# Patient Record
Sex: Male | Born: 1980 | Race: Black or African American | Hispanic: No | Marital: Single | Smoking: Never smoker
Health system: Southern US, Community
[De-identification: ages and names within clinical notes are randomized; demographics above are authoritative.]

---

## 2007-10-21 ENCOUNTER — Emergency Department (HOSPITAL_COMMUNITY): Admission: EM | Admit: 2007-10-21 | Discharge: 2007-10-21 | Payer: Self-pay | Admitting: Emergency Medicine

## 2007-11-17 ENCOUNTER — Emergency Department (HOSPITAL_COMMUNITY): Admission: EM | Admit: 2007-11-17 | Discharge: 2007-11-17 | Payer: Self-pay | Admitting: Emergency Medicine

## 2008-08-14 ENCOUNTER — Emergency Department (HOSPITAL_COMMUNITY): Admission: EM | Admit: 2008-08-14 | Discharge: 2008-08-14 | Payer: Self-pay | Admitting: Emergency Medicine

## 2009-01-14 ENCOUNTER — Emergency Department (HOSPITAL_BASED_OUTPATIENT_CLINIC_OR_DEPARTMENT_OTHER): Admission: EM | Admit: 2009-01-14 | Discharge: 2009-01-15 | Payer: Self-pay | Admitting: Emergency Medicine

## 2013-05-18 HISTORY — PX: SHOULDER SURGERY: SHX246

## 2013-05-31 ENCOUNTER — Emergency Department (HOSPITAL_COMMUNITY): Payer: Self-pay

## 2013-05-31 ENCOUNTER — Encounter (HOSPITAL_COMMUNITY): Payer: Self-pay | Admitting: Emergency Medicine

## 2013-05-31 ENCOUNTER — Emergency Department (HOSPITAL_COMMUNITY)
Admission: EM | Admit: 2013-05-31 | Discharge: 2013-05-31 | Disposition: A | Discharge status: Home / Self Care | Payer: Self-pay | Attending: Emergency Medicine | Admitting: Emergency Medicine

## 2013-05-31 DIAGNOSIS — W108XXA Fall (on) (from) other stairs and steps, initial encounter: Secondary | ICD-10-CM | POA: Insufficient documentation

## 2013-05-31 DIAGNOSIS — W010XXA Fall on same level from slipping, tripping and stumbling without subsequent striking against object, initial encounter: Secondary | ICD-10-CM | POA: Insufficient documentation

## 2013-05-31 DIAGNOSIS — Y929 Unspecified place or not applicable: Secondary | ICD-10-CM | POA: Insufficient documentation

## 2013-05-31 DIAGNOSIS — S43006A Unspecified dislocation of unspecified shoulder joint, initial encounter: Secondary | ICD-10-CM | POA: Insufficient documentation

## 2013-05-31 DIAGNOSIS — S43005A Unspecified dislocation of left shoulder joint, initial encounter: Secondary | ICD-10-CM

## 2013-05-31 DIAGNOSIS — Y939 Activity, unspecified: Secondary | ICD-10-CM | POA: Insufficient documentation

## 2013-05-31 MED ORDER — OXYCODONE-ACETAMINOPHEN 5-325 MG PO TABS: 2.0000 | ORAL_TABLET | ORAL | Status: DC | PRN

## 2013-05-31 MED ORDER — KETOROLAC TROMETHAMINE 30 MG/ML IJ SOLN
30.0000 mg | Freq: Once | INTRAMUSCULAR | Status: AC
  Administered 2013-05-31: 30 mg via INTRAVENOUS
  Filled 2013-05-31: qty 1

## 2013-05-31 MED ORDER — METHOCARBAMOL 500 MG PO TABS
500.0000 mg | ORAL_TABLET | Freq: Two times a day (BID) | ORAL | Status: DC
Start: 2013-05-31 — End: 2013-05-31

## 2013-05-31 MED ORDER — SODIUM CHLORIDE 0.9 % IV SOLN
INTRAVENOUS | Status: AC | PRN
  Administered 2013-05-31: 125 mL/h via INTRAVENOUS

## 2013-05-31 MED ORDER — PROPOFOL 10 MG/ML IV BOLUS
100.0000 mg | Freq: Once | INTRAVENOUS | Status: DC
  Filled 2013-05-31: qty 20

## 2013-05-31 MED ORDER — MORPHINE SULFATE 4 MG/ML IJ SOLN: 4.0000 mg | Freq: Once | INTRAMUSCULAR | Status: DC

## 2013-05-31 MED ORDER — KETAMINE HCL 10 MG/ML IJ SOLN
100.0000 mg | Freq: Once | INTRAMUSCULAR | Status: DC
  Filled 2013-05-31: qty 10

## 2013-05-31 MED ORDER — MORPHINE SULFATE 4 MG/ML IJ SOLN
6.0000 mg | Freq: Once | INTRAMUSCULAR | Status: AC
  Administered 2013-05-31: 6 mg via INTRAVENOUS
  Filled 2013-05-31: qty 2

## 2013-05-31 MED ORDER — PROPOFOL 10 MG/ML IV BOLUS
INTRAVENOUS | Status: AC | PRN
  Administered 2013-05-31: 30 mg via INTRAVENOUS

## 2013-05-31 MED ORDER — METHOCARBAMOL 500 MG PO TABS
500.0000 mg | ORAL_TABLET | Freq: Two times a day (BID) | ORAL | Status: DC
Start: 2013-05-31 — End: 2021-02-12

## 2013-05-31 MED ORDER — HYDROMORPHONE HCL PF 1 MG/ML IJ SOLN
1.0000 mg | Freq: Once | INTRAMUSCULAR | Status: AC
  Administered 2013-05-31: 1 mg via INTRAVENOUS
  Filled 2013-05-31: qty 1

## 2013-05-31 MED ORDER — KETAMINE HCL 10 MG/ML IJ SOLN
INTRAMUSCULAR | Status: AC | PRN
  Administered 2013-05-31 (×4): 5 mg via INTRAVENOUS
  Administered 2013-05-31: 30 mg via INTRAVENOUS

## 2013-05-31 NOTE — ED Notes (Addendum)
Per EMS, patient fell down some icy steps today and dislocated his L shoulder.   Patient does have drop of the L shoulder compared to the right.   Patient states 10/10 pain sharp.   Patient states has chronic back pain that he gets dilaudid for that is hurting also.  Patient was given 200 mg Fentanyl IV en route.    Patient has 20 R hand placed by EMS.

## 2013-05-31 NOTE — ED Notes (Signed)
Checked with Petal[place]'Malley, [place].  She stated okay to place patient in FT to treat pain and get started before moving to room when available.

## 2013-05-31 NOTE — ED Notes (Signed)
Patient transported to X-ray 

## 2013-05-31 NOTE — Discharge Instructions (Signed)
Shoulder Dislocation  Your shoulder is made up of three bones: the collar bone (clavicle); the shoulder blade (scapula), which includes the socket (glenoid cavity); and the upper arm bone (humerus). Your shoulder joint is the place where these bones meet. Strong, fibrous tissues hold these bones together (ligaments). Muscles and strong, fibrous tissues that connect the muscles to these bones (tendons) allow your arm to move through this joint. The range of motion of your shoulder joint is more extensive than most of your other joints, and the glenoid cavity is very shallow. That is the reason that your shoulder joint is one of the most unstable joints in your body. It is far more prone to dislocation than your other joints. Shoulder dislocation is when your humerus is forced out of your shoulder joint.  CAUSES  Shoulder dislocation is caused by a forceful impact on your shoulder. This impact usually is from an injury, such as a sports injury or a fall.  SYMPTOMS  Symptoms of shoulder dislocation include:   Deformity of your shoulder.   Intense pain.   Inability to move your shoulder joint.   Numbness, weakness, or tingling around your shoulder joint (your neck or down your arm).   Bruising or swelling around your shoulder.  DIAGNOSIS  In order to diagnose a dislocated shoulder, your caregiver will perform a physical exam. Your caregiver also may have an X-ray exam done to see if you have any broken bones. Magnetic resonance imaging (MRI) is a procedure that sometimes is done to help your caregiver see any damage to the soft tissues around your shoulder, particularly your rotator cuff tendons. Additionally, your caregiver also may have electromyography done to measure the electrical discharges produced in your muscles if you have signs or symptoms of nerve damage.  TREATMENT  A shoulder dislocation is treated by placing the humerus back in the joint (reduction). Your caregiver does this either manually (closed  reduction), by moving your humerus back into the joint through manipulation, or through surgery (open reduction). When your humerus is back in place, severe pain should improve almost immediately.  You also may need to have surgery if you have a weak shoulder joint or ligaments, and you have recurring shoulder dislocations, despite rehabilitation. In rare cases, surgery is necessary if your nerves or blood vessels are damaged during the dislocation.  After your reduction, your arm will be placed in a shoulder immobilizer or sling to keep it from moving. Your caregiver will have you wear your shoulder immoblizer or sling for 3 days to 3 weeks, depending on how serious your dislocation is. When your shoulder immobilizer or sling is removed, your caregiver may prescribe physical therapy to help improve the range of motion in your shoulder joint.  HOME CARE INSTRUCTIONS   The following measures can help to reduce pain and speed up the healing process:   Rest your injured joint. Do not move it. Avoid activities similar to the one that caused your injury.   Apply ice to your injured joint for the first day or two after your reduction or as directed by your caregiver. Applying ice helps to reduce inflammation and pain.   Put ice in a plastic bag.   Place a towel between your skin and the bag.   Leave the ice on for 15-20 minutes at a time, every 2 hours while you are awake.   Exercise your hand by squeezing a soft ball. This helps to eliminate stiffness and swelling in your hand and   shoulder immobilizer or sling becomes damaged.  Your pain becomes worse rather than better.  You lose feeling in your arm or hand, or they become white and cold. MAKE SURE YOU:   Understand these instructions.  Will watch your condition.  Will get help right away if you are not  doing well or get worse. Document Released: 01/27/2001 Document Revised: 07/27/2011 Document Reviewed: 02/22/2011 [facility]Exit[facility] Patient Information 2014 [place]ExitCare, [place]LLC. Arm Sling Use A sling is used to:  Limit how much your arm moves.  Make you more comfortable.  Support your arm. The sling fits well if:  Your elbow rests in the bottom and corner pocket.  Only your fingers show at the opening. Your wrist should fit inside and be supported by the sling.  The strap goes around your shoulder or neck for support.  Your arm is fairly level with your hand, slightly higher than your elbow. HOME CARE   Adjust the sling to keep the hand inside. Slings tend to slip, making the elbow point up. Tug the elbow back into place.  The fingers should feel warm and be a normal color.  Try to keep the palm of the hand toward the body while wearing the sling.  Take the sling off when going to sleep if this is okay with your doctor.  Use an extra pillow at night to protect the arm. Slide the arm between a pillow and the cover.  Take baths or showers as told by your doctor.  Only take medicine as told by your doctor. GET HELP RIGHT AWAY IF:   The fingers turn cold or start to tingle.  The arm pain gets worse.  The pain is not helped by medicine or by adjusting the sling. MAKE SURE YOU:   Understand these instructions.  Will watch this condition.  Will get help right away if you are not doing well or get worse. Document Released: 10/21/2007 Document Revised: 07/27/2011 Document Reviewed: 10/21/2007 [facility] Patient Information 2014 [place]ExitCare, MarylandLLC.

## 2013-05-31 NOTE — ED Notes (Signed)
MD at bedside. 

## 2013-05-31 NOTE — ED Notes (Signed)
Left shoulder immobilizer placed. Tolerated well.

## 2013-05-31 NOTE — ED Provider Notes (Signed)
CSN: [id number]     Arrival date & time 05/31/13  1012 History   First MD Initiated Contact with Patient 05/31/13 1039     Chief Complaint  Patient presents with  . Fall   (Consider location/radiation/quality/duration/timing/severity/associated sxs/prior Treatment) Patient is a 33 y.o. male presenting with fall. The history is provided by the patient.  Fall   patient here complaining of pain to his left shoulder after he fell on slipping down the stairs. No loss of consciousness. Pain characterized as sharp and worse with movement. Able to move his left hand appropriately. Denies any neck or back pain. No treatment used prior to arrival. [facility]  History reviewed. No pertinent past medical history. History reviewed. No pertinent past surgical history. No family history on file. History  Substance Use Topics  . Smoking status: Never Smoker   . Smokeless tobacco: Not on file  . Alcohol Use: No    Review of Systems  All other systems reviewed and are negative.    Allergies  Review of patient's allergies indicates no known allergies.  Home Medications  No current outpatient prescriptions on file. BP 131/105  Pulse 103  Temp(Src) 97.9 F (36.6 C) (Oral)  Resp 18  Ht 5\' 11"  (1.803 m)  Wt 300 lb (136.079 kg)  BMI 41.86 kg/m2  SpO2 94% Physical Exam  Nursing note and vitals reviewed. Constitutional: He is oriented to person, place, and time. He appears well-developed and well-nourished.  Non-toxic appearance. No distress.  HENT:  Head: Normocephalic and atraumatic.  Eyes: Conjunctivae, EOM and lids are normal. Pupils are equal, round, and reactive to light.  Neck: Normal range of motion. Neck supple. No tracheal deviation present. No mass present.  Cardiovascular: Normal rate, regular rhythm and normal heart sounds.  Exam reveals no gallop.   No murmur heard. Pulmonary/Chest: Effort normal and breath sounds normal. No stridor. No  respiratory distress. He has no decreased breath sounds. He has no wheezes. He has no rhonchi. He has no rales.  Abdominal: Soft. Normal appearance and bowel sounds are normal. He exhibits no distension. There is no tenderness. There is no rebound and no CVA tenderness.  Musculoskeletal: Normal range of motion. He exhibits no edema and no tenderness.       Left shoulder: He exhibits bony tenderness and pain.       Arms: Neurological: He is alert and oriented to person, place, and time. He has normal strength. No cranial nerve deficit or sensory deficit. GCS eye subscore is 4. GCS verbal subscore is 5. GCS motor subscore is 6.  Skin: Skin is warm and dry. No abrasion and no rash noted.  Psychiatric: He has a normal mood and affect. His speech is normal and behavior is normal.    ED Course  Reduction of dislocation Date/Time: 05/31/2013 12:53 PM Performed by: Toy BakerALLEN, Saphyra Hutt T Authorized by: Lorre NickALLEN, Kaylany Tesoriero T Consent: Verbal consent obtained. written consent obtained. Consent given by: patient Patient understanding: patient states understanding of the procedure being performed Patient identity confirmed: verbally with patient Time out: Immediately prior to procedure a "time out" was called to verify the correct patient, procedure, equipment, support staff and site/side marked as required. Patient sedated: yes Sedatives: ketamine and propofol Analgesia: ketamine Sedation start date/time: 05/31/2013 12:35 PM Sedation end date/time: 05/31/2013 12:50 PM Vitals: Vital signs were monitored during sedation. Patient tolerance: Patient tolerated the procedure well with no immediate complications.   (including critical care time) Labs Review Labs  Reviewed - No data to display Imaging Review Dg Shoulder Left  05/31/2013   CLINICAL DATA:  23 -year-old male status post fall with severe pain. Initial encounter.  EXAM: LEFT SHOULDER - 2+ VIEW  COMPARISON:  None.  FINDINGS: Anterior subcoracoid  glenohumeral joint dislocation. No definite proximal humerus or scapula fracture. Left clavicle appears intact. Visible left ribs intact.  IMPRESSION: Anterior subcoracoid glenohumeral joint dislocation.   Electronically Signed   By: Augusto Gamble M.D.   On: 05/31/2013 11:26    EKG Interpretation   None       MDM  No diagnosis found. Shoulder reduced using traction/countertraction method. Patient given pain medication prior to this.  2:57 PM Patient has recovered fully from his sedation. His shoulder is relocated. He postreduction films. He'll be given referral to orthopedics as well as prescription for pain medication.  Toy Baker, MD 05/31/13 [id number]

## 2013-06-27 ENCOUNTER — Emergency Department (HOSPITAL_COMMUNITY)
Admission: EM | Admit: 2013-06-27 | Discharge: 2013-06-27 | Disposition: A | Discharge status: Home / Self Care | Payer: Self-pay | Attending: Emergency Medicine | Admitting: Emergency Medicine

## 2013-06-27 ENCOUNTER — Encounter (HOSPITAL_COMMUNITY): Payer: Self-pay | Admitting: Emergency Medicine

## 2013-06-27 ENCOUNTER — Emergency Department (HOSPITAL_COMMUNITY): Payer: Self-pay

## 2013-06-27 DIAGNOSIS — M24419 Recurrent dislocation, unspecified shoulder: Secondary | ICD-10-CM | POA: Insufficient documentation

## 2013-06-27 DIAGNOSIS — Z79899 Other long term (current) drug therapy: Secondary | ICD-10-CM | POA: Insufficient documentation

## 2013-06-27 MED ORDER — HYDROMORPHONE HCL PF 1 MG/ML IJ SOLN
2.0000 mg | Freq: Once | INTRAMUSCULAR | Status: AC
  Administered 2013-06-27: 2 mg via INTRAMUSCULAR
  Filled 2013-06-27: qty 2

## 2013-06-27 MED ORDER — HYDROCODONE-ACETAMINOPHEN 5-325 MG PO TABS: 1.0000 | ORAL_TABLET | ORAL | Status: DC | PRN

## 2013-06-27 NOTE — Discharge Instructions (Signed)
Acromioclavicular Injuries  The AC (acromioclavicular) joint is the joint in the shoulder where the collarbone (clavicle) meets the shoulder blade (scapula). The part of the shoulder blade connected to the collarbone is called the acromion. Common problems with and treatments for the AC joint are detailed below.  ARTHRITIS  Arthritis occurs when the joint has been injured and the smooth padding between the joints (cartilage) is lost. This is the wear and tear seen in most joints of the body if they have been overused. This causes the joint to produce pain and swelling which is worse with activity.   AC JOINT SEPARATION  AC joint separation means that the ligaments connecting the acromion of the shoulder blade and collarbone have been damaged, and the two bones no longer line up. AC separations can be anywhere from mild to severe, and are "graded" depending upon which ligaments are torn and how badly they are torn.   Grade I Injury: the least damage is done, and the AC joint still lines up.   Grade II Injury: damage to the ligaments which reinforce the AC joint. In a Grade II injury, these ligaments are stretched but not entirely torn. When stressed, the AC joint becomes painful and unstable.   Grade III Injury: AC and secondary ligaments are completely torn, and the collarbone is no longer attached to the shoulder blade. This results in deformity; a prominence of the end of the clavicle.  AC JOINT FRACTURE  AC joint fracture means that there has been a break in the bones of the AC joint, usually the end of the clavicle.  TREATMENT  TREATMENT OF AC ARTHRITIS   There is currently no way to replace the cartilage damaged by arthritis. The best way to improve the condition is to decrease the activities which aggravate the problem. Application of ice to the joint helps decrease pain and soreness (inflammation). The use of non-steroidal anti-inflammatory medication is helpful.   If less conservative measures do not  work, then cortisone shots (injections) may be used. These are anti-inflammatories; they decrease the soreness in the joint and swelling.   If non-surgical measures fail, surgery may be recommended. The procedure is generally removal of a portion of the end of the clavicle. This is the part of the collarbone closest to your acromion which is stabilized with ligaments to the acromion of the shoulder blade. This surgery may be performed using a tube-like instrument with a light (arthroscope) for looking into a joint. It may also be performed as an open surgery through a small incision by the surgeon. Most patients will have good range of motion within 6 weeks and may return to all activity including sports by 8-12 weeks, barring complications.  TREATMENT OF AN AC SEPARATION   The initial treatment is to decrease pain. This is best accomplished by immobilizing the arm in a sling and placing an ice pack to the shoulder for 20 to 30 minutes every 2 hours as needed. As the pain starts to subside, it is important to begin moving the fingers, wrist, elbow and eventually the shoulder in order to prevent a stiff or "frozen" shoulder. Instruction on when and how much to move the shoulder will be provided by your caregiver. The length of time needed to regain full motion and function depends on the amount or grade of the injury. Recovery from a Grade I AC separation usually takes 10 to 14 days, whereas a Grade III may take 6 to 8 weeks.   Grade   I and II separations usually do not require surgery. Even Grade III injuries usually allow return to full activity with few restrictions. Treatment is also based on the activity demands of the injured shoulder. For example, a high level quarterback with an injured throwing arm will receive more aggressive treatment than someone with a desk job who rarely uses his/her arm for strenuous activities. In some cases, a painful lump may persist which could require a later surgery. Surgery  can be very successful, but the benefits must be weighed against the potential risks.  TREATMENT OF AN AC JOINT FRACTURE  Fracture treatment depends on the type of fracture. Sometimes a splint or sling may be all that is required. Other times surgery may be required for repair. This is more frequently the case when the ligaments supporting the clavicle are completely torn. Your caregiver will help you with these decisions and together you can decide what will be the best treatment.  HOME CARE INSTRUCTIONS    Apply ice to the injury for 15-20 minutes each hour while awake for 2 days. Put the ice in a plastic bag and place a towel between the bag of ice and skin.   If a sling has been applied, wear it constantly for as long as directed by your caregiver, even at night. The sling or splint can be removed for bathing or showering or as directed. Be sure to keep the shoulder in the same place as when the sling is on. Do not lift the arm.   If a figure-of-eight splint has been applied it should be tightened gently by another person every day. Tighten it enough to keep the shoulders held back. Allow enough room to place the index finger between the body and strap. Loosen the splint immediately if there is numbness or tingling in the hands.   Take over-the-counter or prescription medicines for pain, discomfort or fever as directed by your caregiver.   If you or your child has received a follow up appointment, it is very important to keep that appointment in order to avoid long term complications, chronic pain or disability.  SEEK MEDICAL CARE IF:    The pain is not relieved with medications.   There is increased swelling or discoloration that continues to get worse rather than better.   You or your child has been unable to follow up as instructed.   There is progressive numbness and tingling in the arm, forearm or hand.  SEEK IMMEDIATE MEDICAL CARE IF:    The arm is numb, cold or pale.   There is increasing pain  in the hand, forearm or fingers.  MAKE SURE YOU:    Understand these instructions.   Will watch your condition.   Will get help right away if you are not doing well or get worse.  Document Released: 02/11/2005 Document Revised: 07/27/2011 Document Reviewed: 08/06/2008  ExitCare Patient Information 2014 [employer], [employer].

## 2013-06-27 NOTE — ED Notes (Signed)
Per pt sts hx of left shoulder dislocation and he feels like it is dislocated. sts recently at [facility] and had conscious sedation to have it placed. sts is in a lot of pain and seeing a ortho. sts suppose to have an MRI soon.

## 2013-06-27 NOTE — ED Provider Notes (Signed)
CSN: [id number]     Arrival date & time 06/27/13  1512 History  This chart was scribed for Gregory Pel, PA-C, working with Gregory Porter, MD by Gregory Pollard, ED Scribe. This patient was seen in room TR08C/TR08C and the patient's care was started at 4:45 PM.      Chief Complaint  Patient presents with  . Shoulder Problem      HPI  HPI Comments: Gregory Pollard is a 33 y.o. male who presents to the Emergency Department complaining of constant, severe left shoulder pain that began prior to arrival. He states that he was seen here on 05/31/13 for a left shoulder dislocation that occurred after he slipped down the stairs. He was discharged after the shoulder was reduced using traction/countertraction method and the post reduction x-ray came back normal. He was placed in a splint but states that the shoulder became dislocated again. He was consciously sedated at [facility] and the shoulder was reduced again. He states that the pain he felt today is similar pain to the prior two dislocations. He is taking oxycodone and a muscle relaxer at home for the baseline pain from the first injury with significant relief.    History reviewed. No pertinent past medical history. History reviewed. No pertinent past surgical history. History reviewed. No pertinent family history. History  Substance Use Topics  . Smoking status: Never Smoker   . Smokeless tobacco: Not on file  . Alcohol Use: No    Review of Systems  Musculoskeletal: Positive for arthralgias.  All other systems reviewed and are negative.      Allergies  Review of patient's allergies indicates no known allergies.  Home Medications   Current Outpatient Rx  Name  Route  Sig  Dispense  Refill  . methocarbamol (ROBAXIN) 500 MG tablet   Oral   Take 1 tablet (500 mg total) by mouth 2 (two) times daily.   20 tablet   0   . oxyCODONE-acetaminophen (PERCOCET/ROXICET) 5-325 MG per tablet   Oral   Take 2 tablets by mouth every 4  (four) hours as needed for severe pain.   20 tablet   0    Triage Vitals: BP 126/92  Pulse 110  Temp(Src) 97.3 F (36.3 C)  Resp 18  SpO2 96%  Physical Exam  Nursing note and vitals reviewed. Constitutional: He appears well-developed and well-nourished. No distress.  HENT:  Head: Normocephalic and atraumatic.  Eyes: Pupils are equal, round, and reactive to light.  Neck: Normal range of motion. Neck supple.  Cardiovascular: Normal rate and regular rhythm.   Pulmonary/Chest: Effort normal.  Abdominal: Soft.  Musculoskeletal:       Left shoulder: He exhibits decreased range of motion, tenderness, pain and spasm. He exhibits no bony tenderness, no swelling, no effusion, no crepitus, no deformity, no laceration, normal pulse and normal strength.       Arms: Neurological: He is alert.  Skin: Skin is warm and dry.    ED Course  Procedures (including critical care time)  DIAGNOSTIC STUDIES: Oxygen Saturation is 96% on room air, adequate by my interpretation.    COORDINATION OF CARE: 4:52 PM -Reviewed radiology results with patient. Patient states that he will have someone pick him up from the ED, so will order 2 mg injection of Dilaudid. Patient verbalizes understanding and agrees with treatment plan.    Labs Review Labs Reviewed - No data to display Imaging Review Dg Shoulder Left  06/27/2013   CLINICAL DATA:  Left shoulder pain.  History of prior dislocation.  EXAM: LEFT SHOULDER - 2+ VIEW  COMPARISON:  Plain film 05/31/2013.  FINDINGS: The humerus is located and the acromioclavicular joint is intact. No fracture is identified. No degenerative change is seen about the shoulder. Imaged left lung and ribs are unremarkable.  IMPRESSION: Negative exam.   Electronically Signed   By: Gregory Pollard  Dalessio M.D.   On: 06/27/2013 16:24    EKG Interpretation   None       MDM   Final diagnoses:  Shoulder dislocation, recurrent   Patients shoulder is not dislocated currently and it  is neurovascularly intact.  His shoulder probably did dislocated but is has popped back in on its own. Pain treated in ED and patient referred back to his orthopedist to get his MRI that he needs done.  33 y.o.Ianmichael Destin Pollard's evaluation in the Emergency Department is complete. It has been determined that no acute conditions requiring further emergency intervention are present at this time. The patient/guardian have been advised of the diagnosis and plan. We have discussed signs and symptoms that warrant return to the ED, such as changes or worsening in symptoms.  Vital signs are stable at discharge. Filed Vitals:   06/27/13 1538  BP: 126/92  Pulse: 110  Temp: 97.3 F (36.3 C)  Resp: 18    Patient/guardian has voiced understanding and agreed to follow-up with the PCP or specialist.    Gregory Matasiffany G Berenis Corter, PA-C 06/27/13 1706

## 2013-06-27 NOTE — ED Notes (Signed)
Pts girlfriend is here to pick pt up.

## 2013-06-29 ENCOUNTER — Telehealth (HOSPITAL_COMMUNITY): Payer: Self-pay

## 2013-07-29 NOTE — ED Provider Notes (Signed)
Medical screening examination/treatment/procedure(s) were performed by non-physician practitioner and as supervising physician I was immediately available for consultation/collaboration.   EKG Interpretation None        Rolland PorterMark Teddie Mehta, MD 07/29/13 [phone]

## 2014-08-28 IMAGING — CR DG SHOULDER 2+V*L*
2 series · 2 of 2 positions shown · non-contrast
Comparison: 0022 hr the same day.

CLINICAL DATA: 32-year-old male status post reduction. Fall. Pain.
Initial encounter.

EXAM:
LEFT SHOULDER - 2+ VIEW

[w shoulder ap internal left]
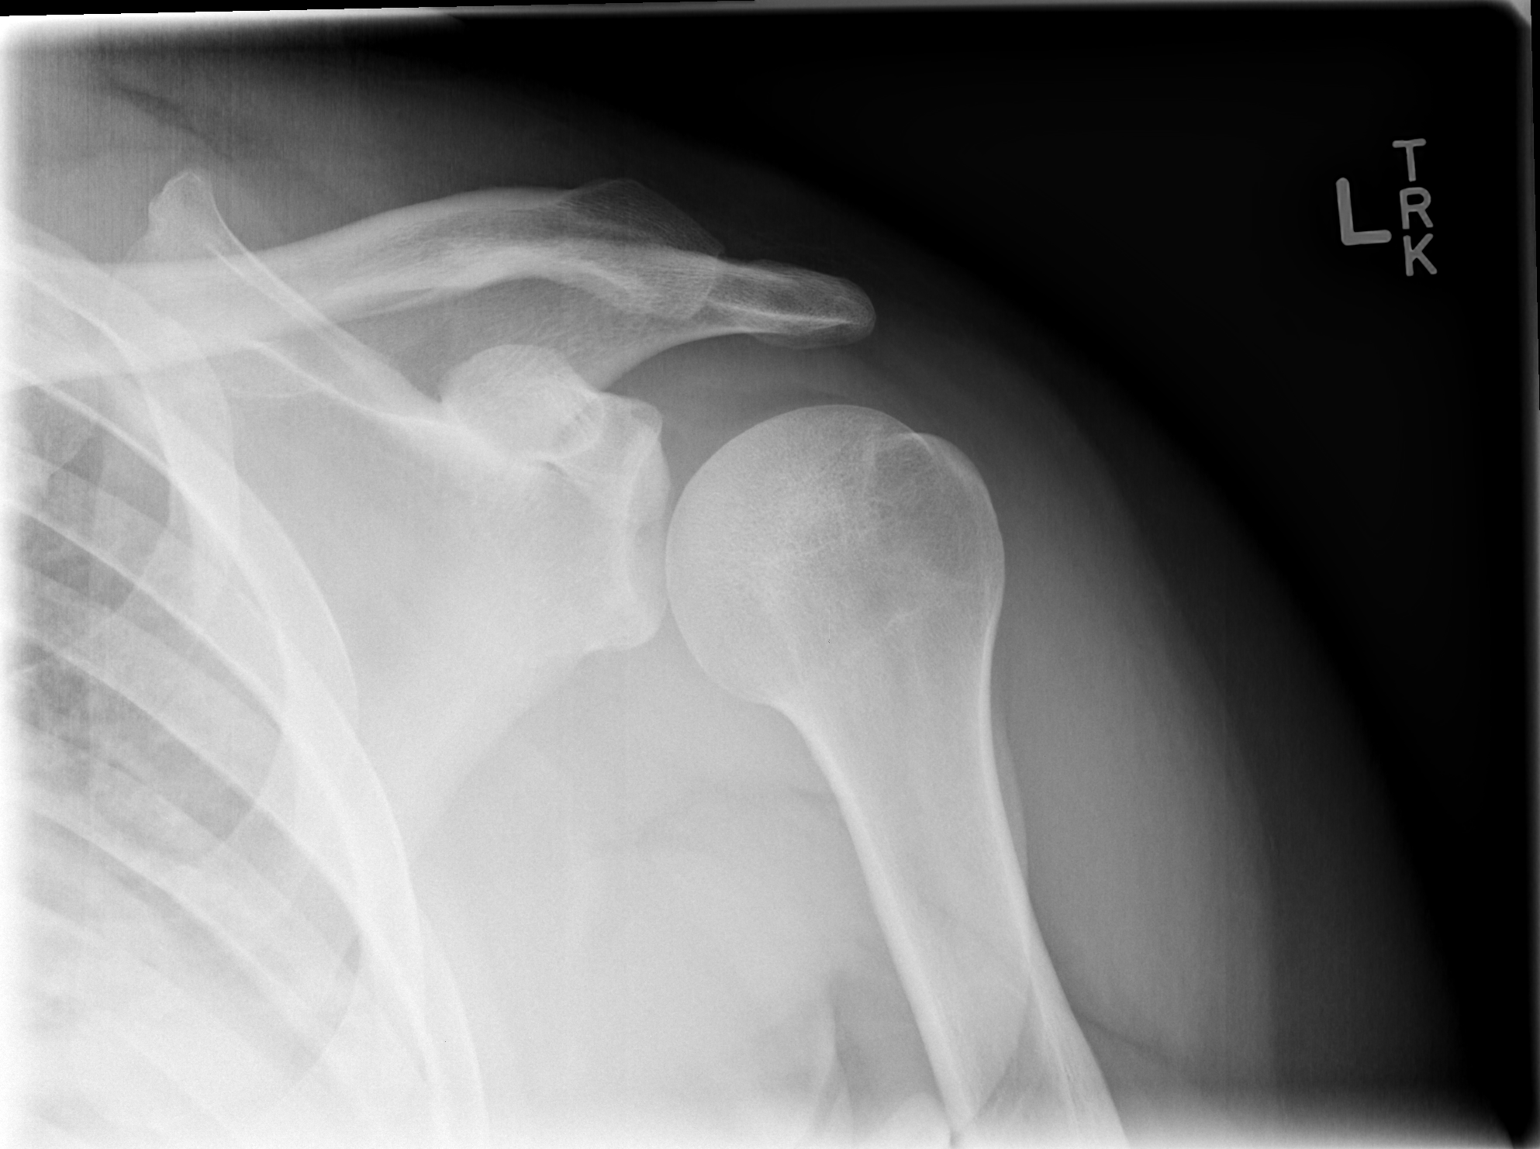

[w shoulder y view left]
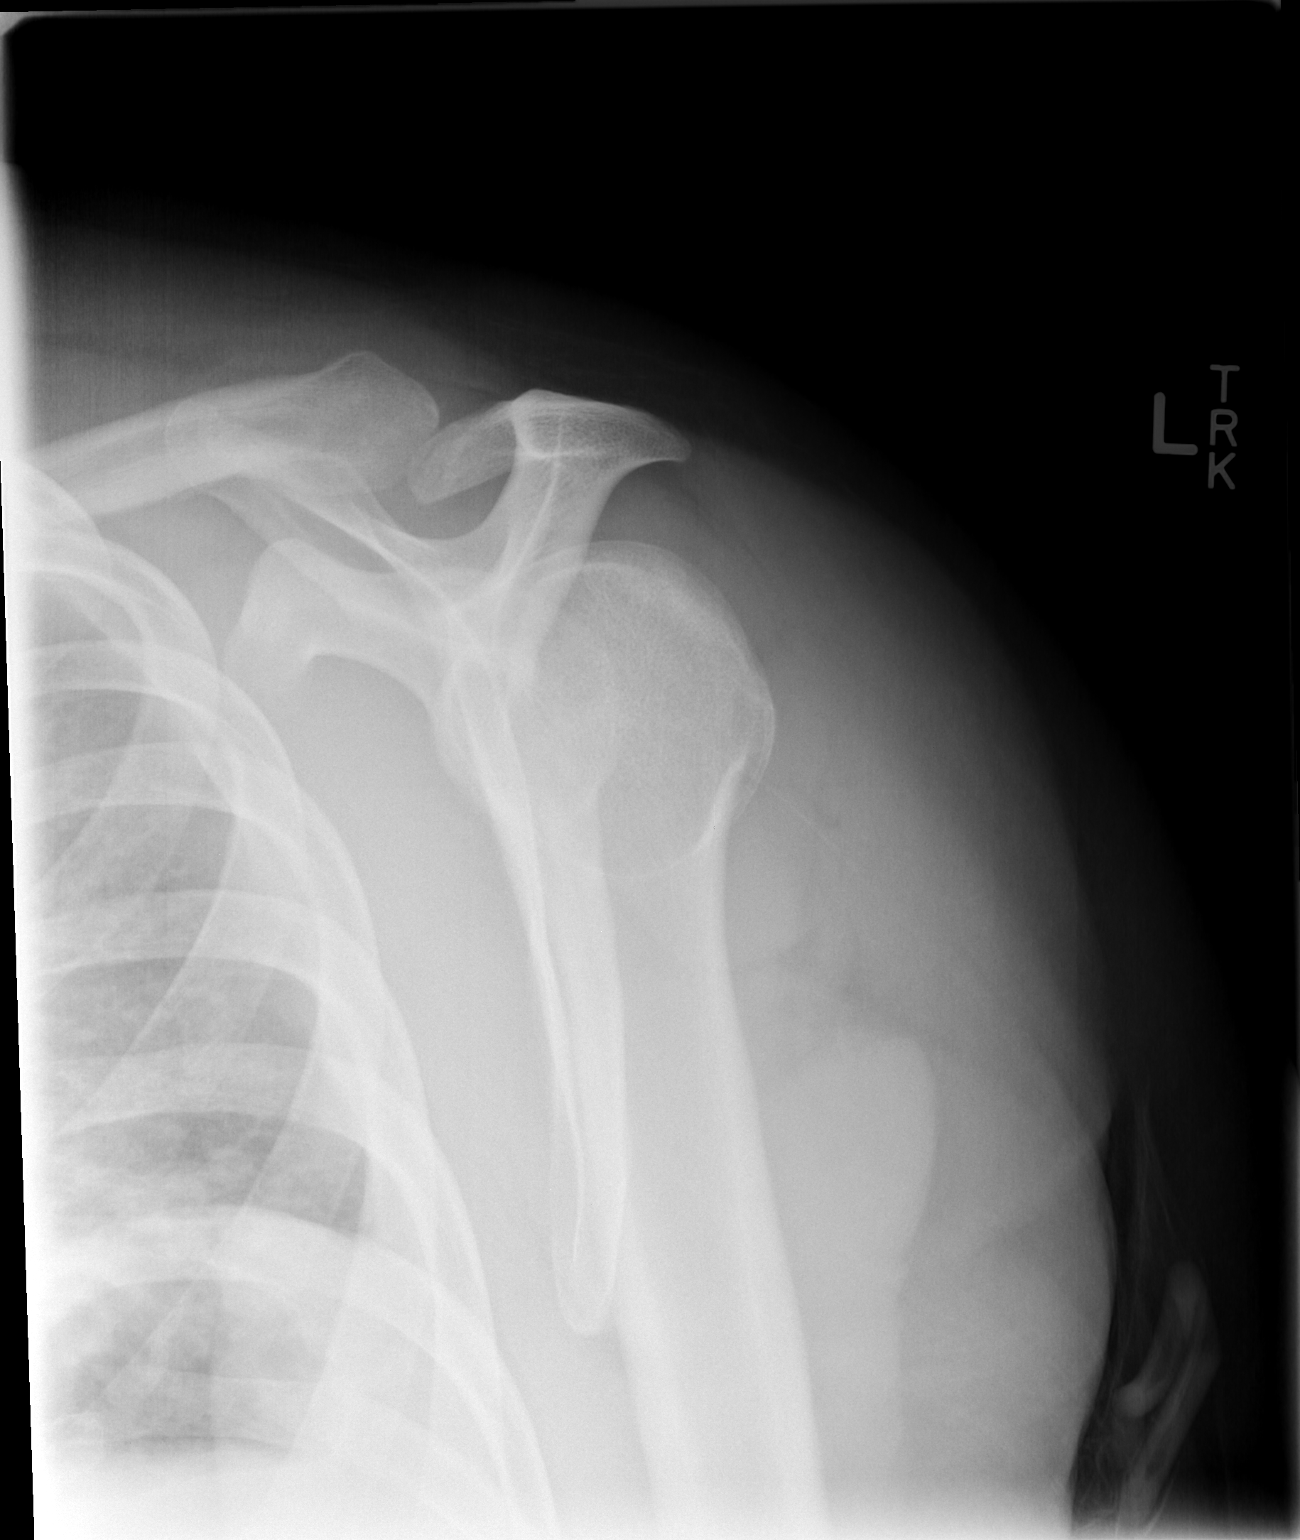

[2 of 2 positions shown; findings below may reference images not displayed]

FINDINGS: Reduced left glenohumeral joint. Proximal left humerus appears
intact. Scapula and clavicle appear intact. Bone mineralization is
within normal limits. Negative visualized left ribs.
IMPRESSION: Reduced left glenohumeral joint dislocation. No acute fracture or
dislocation identified ..

## 2014-08-28 IMAGING — CR DG SHOULDER 2+V*L*
2 series · 2 of 2 positions shown · non-contrast
Comparison: None.

CLINICAL DATA: [age] -year-old male status post fall with
severe pain. Initial encounter.

EXAM:
LEFT SHOULDER - 2+ VIEW

[w shoulder ap internal left]
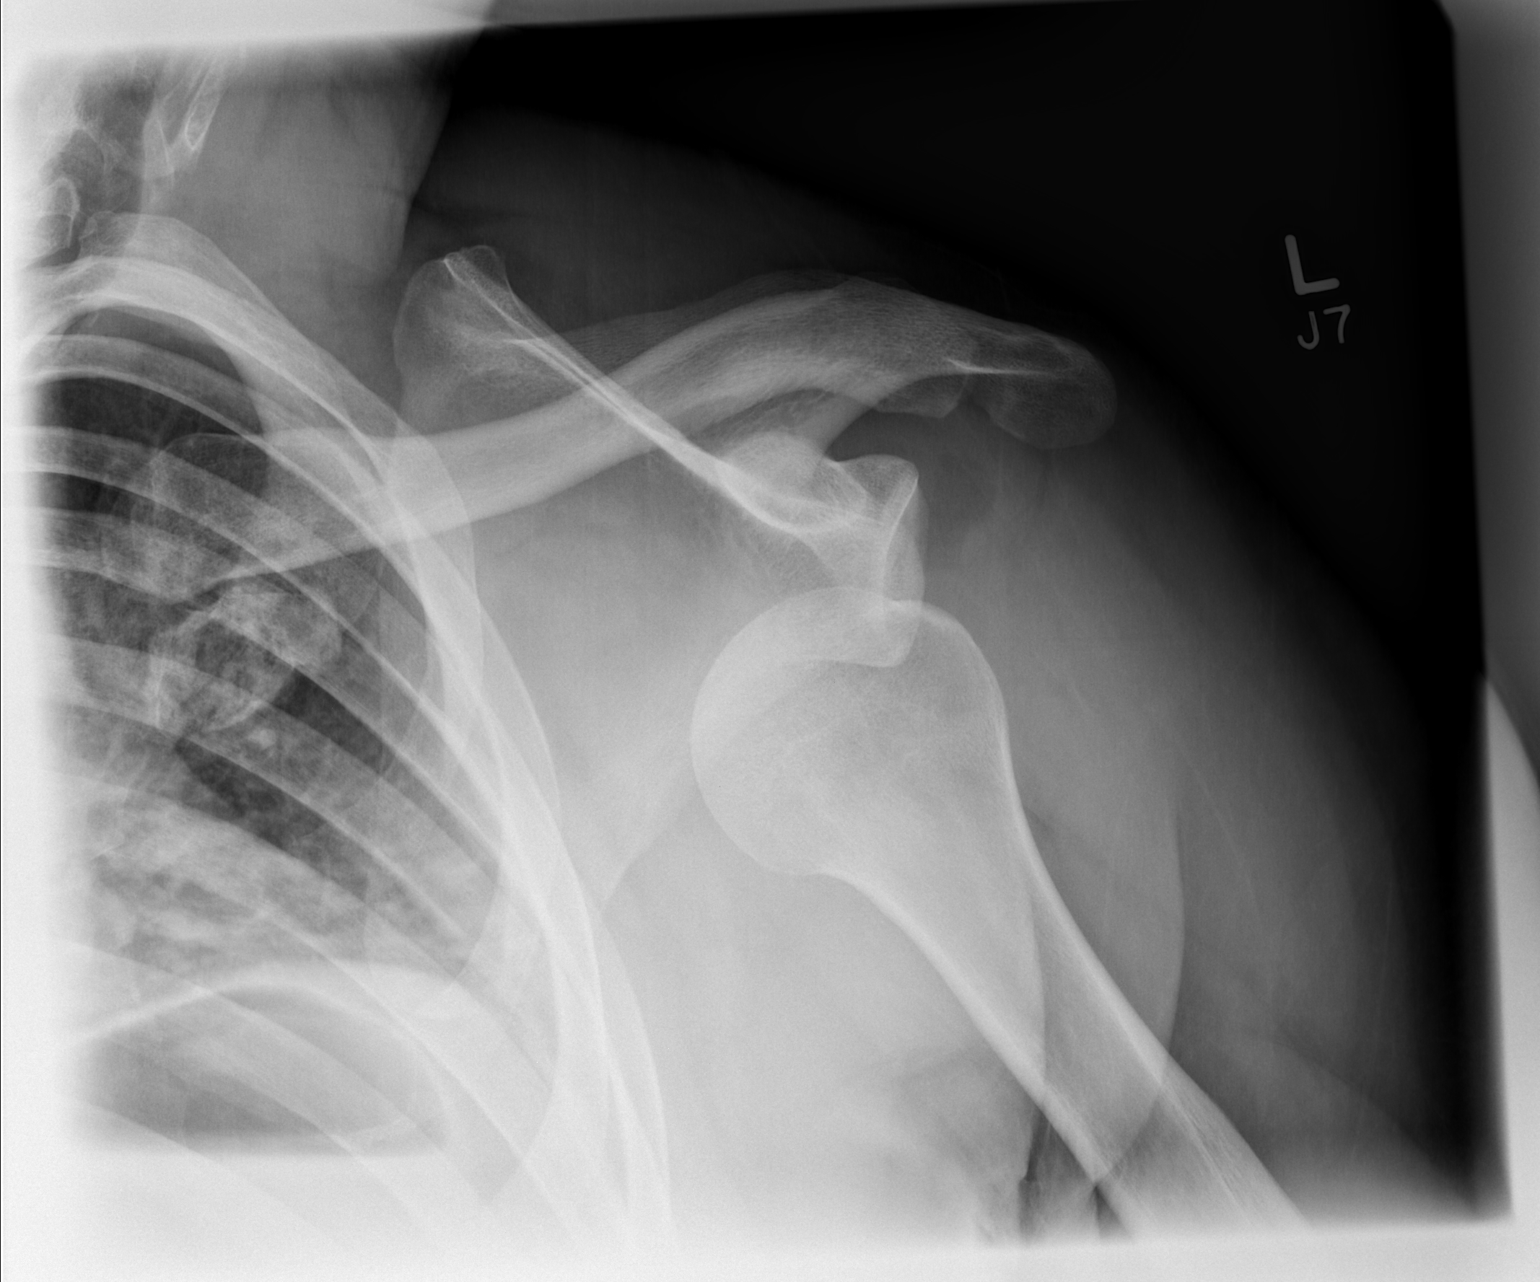

[w shoulder y view left]
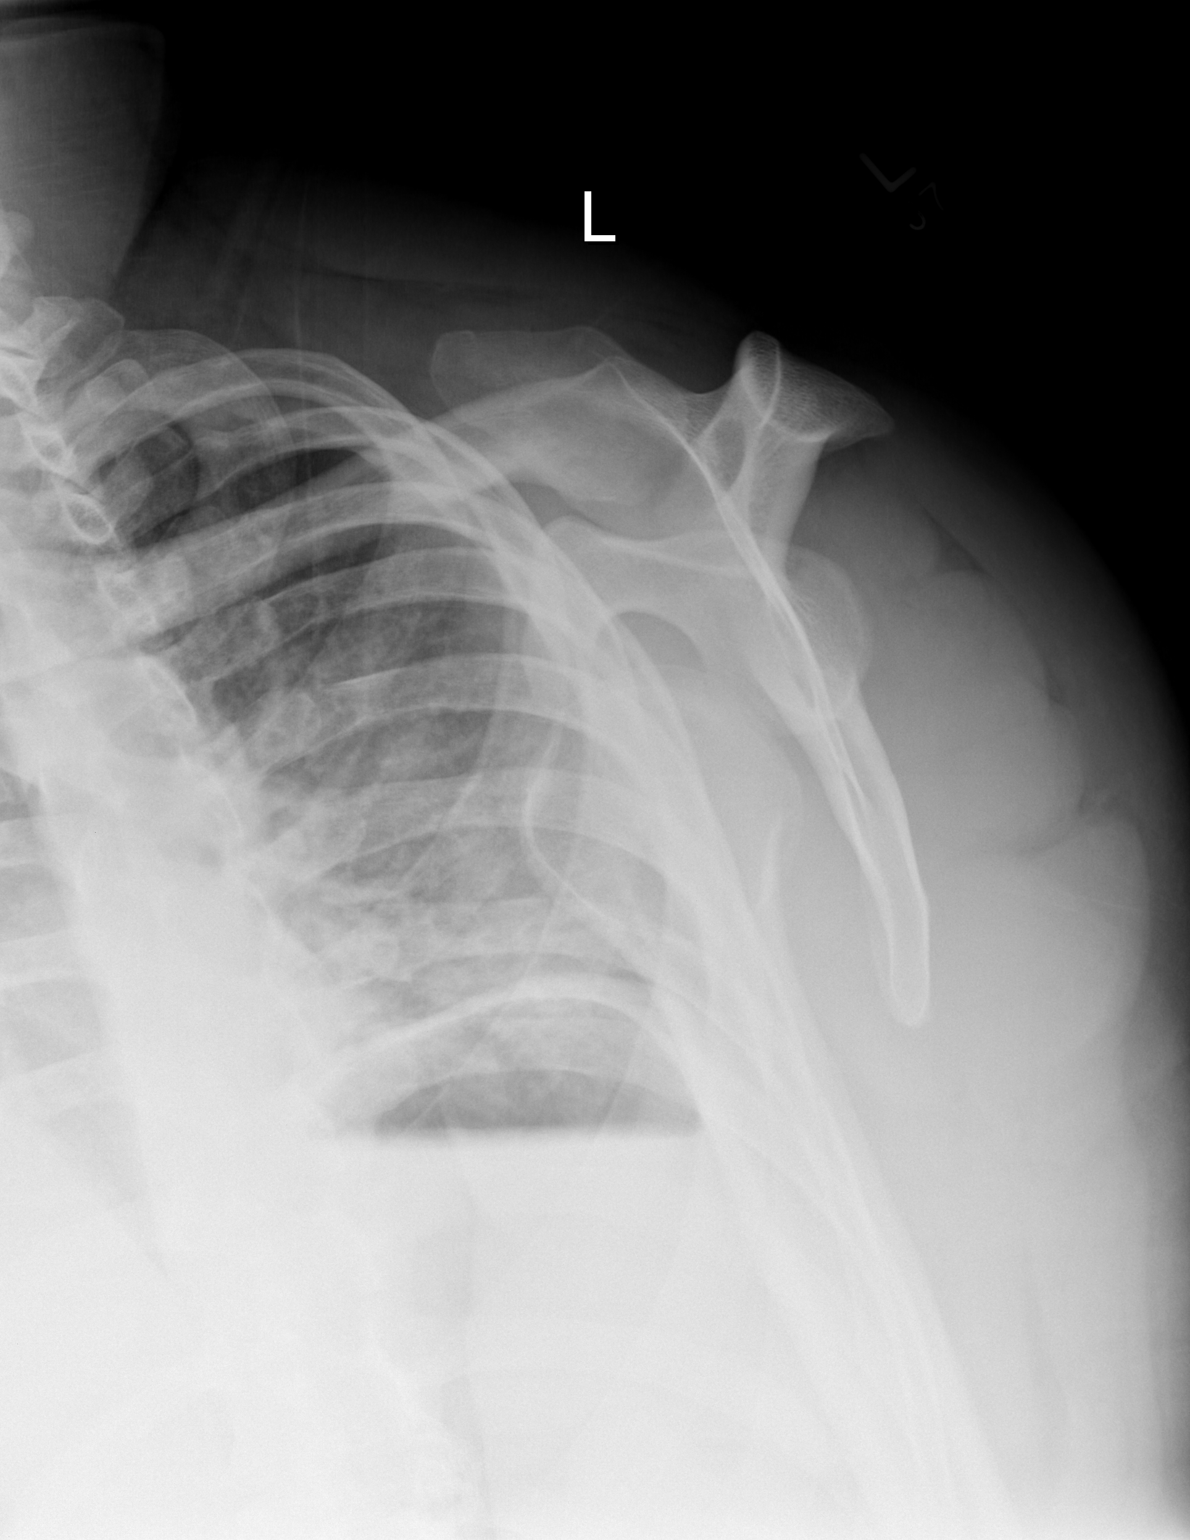

[2 of 2 positions shown; findings below may reference images not displayed]

FINDINGS: Anterior subcoracoid glenohumeral joint dislocation. No definite
proximal humerus or scapula fracture. Left clavicle appears intact.
Visible left ribs intact.
IMPRESSION: Anterior subcoracoid glenohumeral joint dislocation.

## 2014-09-11 ENCOUNTER — Encounter (HOSPITAL_COMMUNITY): Payer: Self-pay | Admitting: Emergency Medicine

## 2014-09-11 ENCOUNTER — Emergency Department (HOSPITAL_COMMUNITY)
Admission: EM | Admit: 2014-09-11 | Discharge: 2014-09-11 | Disposition: A | Discharge status: Home / Self Care | Payer: Self-pay | Attending: Emergency Medicine | Admitting: Emergency Medicine

## 2014-09-11 DIAGNOSIS — M545 Low back pain, unspecified: Secondary | ICD-10-CM

## 2014-09-11 DIAGNOSIS — Z79899 Other long term (current) drug therapy: Secondary | ICD-10-CM | POA: Insufficient documentation

## 2014-09-11 DIAGNOSIS — E663 Overweight: Secondary | ICD-10-CM | POA: Insufficient documentation

## 2014-09-11 MED ORDER — KETOROLAC TROMETHAMINE 60 MG/2ML IM SOLN
60.0000 mg | Freq: Once | INTRAMUSCULAR | Status: AC
  Administered 2014-09-11: 60 mg via INTRAMUSCULAR
  Filled 2014-09-11: qty 2

## 2014-09-11 MED ORDER — OXYCODONE-ACETAMINOPHEN 5-325 MG PO TABS
1.0000 | ORAL_TABLET | Freq: Once | ORAL | Status: AC
  Administered 2014-09-11: 1 via ORAL
  Filled 2014-09-11: qty 1

## 2014-09-11 MED ORDER — HYDROCODONE-ACETAMINOPHEN 5-325 MG PO TABS: 1.0000 | ORAL_TABLET | Freq: Four times a day (QID) | ORAL | Status: DC | PRN

## 2014-09-11 MED ORDER — IBUPROFEN 600 MG PO TABS: 600.0000 mg | ORAL_TABLET | Freq: Four times a day (QID) | ORAL | Status: DC | PRN

## 2014-09-11 NOTE — ED Notes (Signed)
MD at bedside. EDP Kirk PRESENT 

## 2014-09-11 NOTE — Discharge Instructions (Signed)

## 2014-09-11 NOTE — ED Notes (Addendum)
Patient c/o lower mid back pain and left flank pain. Patient states he was seen here @3  years ago for similar and was told he had pulled a muscle at that time, states pain is the same. Patient denies injury, denies urinary symptoms. Patient ambulatory into triage.

## 2014-09-11 NOTE — ED Provider Notes (Signed)
CSN: [id number]     Arrival date & time 09/11/14  0818 History   First MD Initiated Contact with Patient 09/11/14 [phone]     Chief Complaint  Patient presents with  . Back Pain    left lumbar and left flank     (Consider location/radiation/quality/duration/timing/severity/associated sxs/prior Treatment) HPI  This is a 34 year old male who presents with back pain. Patient reports back pain over the mid and left back. Reports he has had pain for several months but worsening of pain in the last week. States that he was at the gym several months ago and feels he may have pulled a muscle. He stopped working out and pain improved. However, over the last week he has developed worsening throbbing pain over the mid lower and left lower back. It is worse with movement. It is nonradiating. Current pain is 10 out of 10. He is not taking anything for the pain. Reports that this is similar to when he had a pulled muscle several years ago. Denies any weakness, numbness, tingling of lower extremities. Denies any dysuria or hematuria. Denies any bowel or bladder issues.  History reviewed. No pertinent past medical history. Past Surgical History  Procedure Laterality Date  . Shoulder surgery Left 2015   History reviewed. No pertinent family history. History  Substance Use Topics  . Smoking status: Never Smoker   . Smokeless tobacco: Not on file  . Alcohol Use: No    Review of Systems  Constitutional: Negative.  Negative for fever.  Respiratory: Negative.  Negative for chest tightness and shortness of breath.   Cardiovascular: Negative.  Negative for chest pain.  Gastrointestinal: Negative.  Negative for nausea, vomiting and abdominal pain.  Genitourinary: Negative.  Negative for dysuria, hematuria and difficulty urinating.  Musculoskeletal: Positive for back pain.  Neurological: Negative for weakness.  All other systems reviewed and are negative.     Allergies  Review of patient's allergies  indicates no known allergies.  Home Medications   Prior to Admission medications   Medication Sig Start Date End Date Taking? Authorizing Provider  HYDROcodone-acetaminophen (NORCO/VICODIN) 5-325 MG per tablet Take 1 tablet by mouth every 6 (six) hours as needed. 09/11/14   Shon Batonourtney F Horton, MD  ibuprofen (ADVIL,MOTRIN) 600 MG tablet Take 1 tablet (600 mg total) by mouth every 6 (six) hours as needed. 09/11/14   Shon Batonourtney F Horton, MD  methocarbamol (ROBAXIN) 500 MG tablet Take 1 tablet (500 mg total) by mouth 2 (two) times daily. Patient not taking: Reported on 09/11/2014 05/31/13   Lorre NickAnthony Struve, MD  oxyCODONE-acetaminophen (PERCOCET/ROXICET) 5-325 MG per tablet Take 2 tablets by mouth every 4 (four) hours as needed for severe pain. Patient not taking: Reported on 09/11/2014 05/31/13   Lorre NickAnthony Jane, MD   BP 138/90 mmHg  Pulse 72  Temp(Src) 97.8 F (36.6 C) (Oral)  Resp 16  Ht 5\' 11"  (1.803 m)  Wt 280 lb (127.007 kg)  BMI 39.07 kg/m2  SpO2 95% Physical Exam  Constitutional: He is oriented to person, place, and time. He appears well-developed and well-nourished.  overweight  HENT:  Head: Normocephalic and atraumatic.  Eyes: Pupils are equal, round, and reactive to light.  Cardiovascular: Normal rate, regular rhythm and normal heart sounds.   No murmur heard. Pulmonary/Chest: Effort normal and breath sounds normal. No respiratory distress. He has no wheezes.  Abdominal: Soft. Bowel sounds are normal. There is no tenderness. There is no rebound and no guarding.  Musculoskeletal: He exhibits no edema.  Tenderness palpation  over the left lower lumbar paraspinous muscle region, no significant spasm  Neurological: He is alert and oriented to person, place, and time.  Normal gait, 5 out of 5 strength bilateral lower extremities  Skin: Skin is warm and dry.  Psychiatric: He has a normal mood and affect.  Nursing note and vitals reviewed.   ED Course  Procedures (including critical care  time) Labs Review Labs Reviewed - No data to display  Imaging Review No results found.   EKG Interpretation None      MDM   Final diagnoses:  Left-sided low back pain without sciatica    Patient presents with back pain. Nontoxic on exam. No signs or symptoms of cauda equina. No other red flags of back pain. Denies any urinary symptoms. Reports history of the same. Patient has not taken anything. Patient given Toradol and Norco. Patient ambulatory. Discuss with patient back exercises and continued anti-inflammatories at home. He will be given a short course of narcotic pain medication for refractory pain. Patient stated understanding.  After history, exam, and medical workup I feel the patient has been appropriately medically screened and is safe for discharge home. Pertinent diagnoses were discussed with the patient. Patient was given return precautions.     Shon Baton, MD 09/11/14 1006

## 2021-02-11 ENCOUNTER — Emergency Department (HOSPITAL_COMMUNITY)
Admission: EM | Admit: 2021-02-11 | Discharge: 2021-02-12 | Disposition: A | Discharge status: Home / Self Care | Payer: No Typology Code available for payment source | Attending: Emergency Medicine | Admitting: Emergency Medicine

## 2021-02-11 ENCOUNTER — Other Ambulatory Visit: Payer: Self-pay

## 2021-02-11 DIAGNOSIS — Y92410 Unspecified street and highway as the place of occurrence of the external cause: Secondary | ICD-10-CM | POA: Insufficient documentation

## 2021-02-11 DIAGNOSIS — M542 Cervicalgia: Secondary | ICD-10-CM | POA: Diagnosis not present

## 2021-02-11 DIAGNOSIS — R519 Headache, unspecified: Secondary | ICD-10-CM | POA: Diagnosis present

## 2021-02-11 DIAGNOSIS — M545 Low back pain, unspecified: Secondary | ICD-10-CM | POA: Diagnosis not present
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
## 2021-02-12 ENCOUNTER — Other Ambulatory Visit (HOSPITAL_COMMUNITY): Payer: Self-pay

## 2021-02-12 ENCOUNTER — Emergency Department (HOSPITAL_COMMUNITY): Payer: No Typology Code available for payment source

## 2021-02-12 MED ORDER — MELOXICAM 15 MG PO TBDP: 15.0000 mg | ORAL_TABLET | Freq: Every day | ORAL | 0 refills | Status: DC | PRN

## 2021-02-12 MED ORDER — ONDANSETRON 4 MG PO TBDP
4.0000 mg | ORAL_TABLET | Freq: Once | ORAL | Status: AC
  Administered 2021-02-12: 4 mg via ORAL
  Filled 2021-02-12: qty 1

## 2021-02-12 MED ORDER — ONDANSETRON 4 MG PO TBDP
4.0000 mg | ORAL_TABLET | Freq: Three times a day (TID) | ORAL | 0 refills | Status: AC | PRN
Start: 2021-02-12 — End: ?

## 2021-02-12 MED ORDER — LIDOCAINE 5 % EX PTCH
1.0000 | MEDICATED_PATCH | Freq: Every day | CUTANEOUS | 0 refills | Status: AC | PRN
Start: 2021-02-12 — End: ?

## 2021-02-12 MED ORDER — METHOCARBAMOL 500 MG PO TABS
500.0000 mg | ORAL_TABLET | Freq: Three times a day (TID) | ORAL | 0 refills | Status: AC | PRN
Start: 2021-02-12 — End: ?

## 2021-02-12 MED ORDER — OXYCODONE-ACETAMINOPHEN 5-325 MG PO TABS
1.0000 | ORAL_TABLET | Freq: Once | ORAL | Status: AC
  Administered 2021-02-12: 1 via ORAL
  Filled 2021-02-12: qty 1

## 2021-02-12 NOTE — ED Notes (Signed)
Patient verbalizes understanding of discharge instructions. Prescriptions and follow-up care reviewed. Opportunity for questioning and answers were provided. Armband removed by staff, pt discharged from ED ambulatory.  
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
## 2024-04-30 ENCOUNTER — Other Ambulatory Visit: Payer: Self-pay

## 2024-04-30 ENCOUNTER — Encounter (HOSPITAL_BASED_OUTPATIENT_CLINIC_OR_DEPARTMENT_OTHER): Payer: Self-pay | Admitting: Emergency Medicine

## 2024-04-30 DIAGNOSIS — R1032 Left lower quadrant pain: Secondary | ICD-10-CM | POA: Insufficient documentation

## 2024-04-30 LAB — CBC
HCT: 41.3 % (ref 39.0–52.0)
Hemoglobin: 14.2 g/dL (ref 13.0–17.0)
MCH: 30.7 pg (ref 26.0–34.0)
MCHC: 34.4 g/dL (ref 30.0–36.0)
MCV: 89.4 fL (ref 80.0–100.0)
Platelets: 244 K/uL (ref 150–400)
RBC: 4.62 MIL/uL (ref 4.22–5.81)
RDW: 13.4 % (ref 11.5–15.5)
WBC: 6.3 K/uL (ref 4.0–10.5)
nRBC: 0 % (ref 0.0–0.2)
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
## 2024-05-01 ENCOUNTER — Emergency Department (HOSPITAL_BASED_OUTPATIENT_CLINIC_OR_DEPARTMENT_OTHER)
Admission: EM | Admit: 2024-05-01 | Discharge: 2024-05-01 | Disposition: A | Payer: Self-pay | Attending: Emergency Medicine | Admitting: Emergency Medicine

## 2024-05-01 ENCOUNTER — Emergency Department (HOSPITAL_BASED_OUTPATIENT_CLINIC_OR_DEPARTMENT_OTHER): Payer: Self-pay

## 2024-05-01 DIAGNOSIS — I10 Essential (primary) hypertension: Secondary | ICD-10-CM | POA: Insufficient documentation

## 2024-05-01 DIAGNOSIS — R1012 Left upper quadrant pain: Secondary | ICD-10-CM | POA: Insufficient documentation

## 2024-05-01 LAB — COMPREHENSIVE METABOLIC PANEL WITH GFR
ALT: 37 U/L (ref 0–44)
AST: 28 U/L (ref 15–41)
Albumin: 4.6 g/dL (ref 3.5–5.0)
Alkaline Phosphatase: 57 U/L (ref 38–126)
Anion gap: 12 (ref 5–15)
BUN: 10 mg/dL (ref 6–20)
CO2: 27 mmol/L (ref 22–32)
Calcium: 9.7 mg/dL (ref 8.9–10.3)
Chloride: 101 mmol/L (ref 98–111)
Creatinine, Ser: 1.2 mg/dL (ref 0.61–1.24)
GFR, Estimated: >60 mL/min (ref >60)
Glucose, Bld: 93 mg/dL (ref 70–99)
Potassium: 3.8 mmol/L (ref 3.5–5.1)
Sodium: 139 mmol/L (ref 135–145)
Total Bilirubin: 0.6 mg/dL (ref 0.0–1.2)
Total Protein: 7.5 g/dL (ref 6.5–8.1)

## 2024-05-01 LAB — URINALYSIS, ROUTINE W REFLEX MICROSCOPIC
Bilirubin Urine: NEGATIVE
Glucose, UA: NEGATIVE mg/dL
Hgb urine dipstick: NEGATIVE
Ketones, ur: 15 mg/dL — AB
Leukocytes,Ua: NEGATIVE
Nitrite: NEGATIVE
Protein, ur: NEGATIVE mg/dL
Specific Gravity, Urine: 1.025 (ref 1.005–1.030)
pH: 5.5 (ref 5.0–8.0)

## 2024-05-01 LAB — LIPASE, BLOOD: Lipase: 23 U/L (ref 11–51)

## 2024-05-01 MED ORDER — HYDROCODONE-ACETAMINOPHEN 5-325 MG PO TABS
2.0000 | ORAL_TABLET | Freq: Once | ORAL | Status: AC
  Administered 2024-05-01: 02:00:00 2 via ORAL
  Filled 2024-05-01: qty 2

## 2024-05-01 NOTE — Discharge Instructions (Addendum)
°  SEEK IMMEDIATE MEDICAL ATTENTION IF: The pain does not go away or becomes severe, particularly over the next 8-12 hours.  A temperature above 100.19F develops.  Repeated vomiting occurs (multiple episodes).  The pain becomes localized to portions of the abdomen. The right side could possibly be appendicitis. In an adult, the left lower portion of the abdomen could be colitis or diverticulitis.  Blood is being passed in stools or vomit (bright red or black tarry stools).  Return also if you develop chest pain, difficulty breathing, dizziness or fainting, or become confused, poorly responsive, or inconsolable.   Thank you for the opportunity to take care of you in our Emergency Department. You have been diagnosed with high blood pressure, also known as hypertension. This means that the force of blood against the walls of your blood vessels called is too strong. It also means that your heart has to work harder to move the blood. High blood pressure usually has no symptoms, but over time, it can cause serious health problems such as Heart attack and heart failure Stroke Kidney disease and failure Vision loss With the help from your healthcare provider and some important life style changes, you can manage your blood pressure and protect your health. Please read the instructions provided on hypertension, how to manage it and how to check your blood pressure. Additionally, use the blood pressure log provided to record your blood pressures. Take the blood pressure log with you to your primary care doctor so that they can adjust your blood pressure medications if needed. Please read the instructions on follow-up appointment. Return to the ER or Call 911 right away if you have any of these symptoms: Chest pain or shortness of breath Severe headache Weakness, tingling, or numbness of your face, arms, or legs (especially on 1 side of the body) Sudden change in vision Confusion, trouble speaking, or  trouble understanding speech

## 2024-05-01 NOTE — ED Notes (Signed)
 Patient transported to CT

## 2024-05-01 NOTE — ED Provider Notes (Signed)
 [facility] Provider Note   CSN: [id number] Arrival date & time: 04/30/24  2305     Patient presents with: Abdominal Pain   Gregory Pollard is a 43 y.o. male.   The history is provided by the patient.  Abdominal Pain Pain location:  LUQ and LLQ Pain quality: aching   Pain severity:  Moderate Onset quality:  Gradual Duration:  3 days Timing:  Constant Progression:  Worsening Chronicity:  New Worsened by:  Position changes Associated symptoms: no chest pain, no dysuria, no fever, no nausea and no vomiting   Risk factors: has not had multiple surgeries   Patient reports onset of left upper and left lower quadrant abdominal pain around 3 days ago. He reports over 4 days ago he picked up his mother, but had no immediate pain.  No trauma The following day he started having pain mostly in the left upper quadrant is worse with lying flat No fevers or vomiting.  No change in his bowel movements.  No dysuria.  No previous abdominal surgeries. Patient reports he googled his symptoms and is concerned about his spleen     Prior to Admission medications  Not on File    Allergies: Patient has no known allergies.    Review of Systems  Constitutional:  Negative for fever.  Cardiovascular:  Negative for chest pain.  Gastrointestinal:  Positive for abdominal pain. Negative for nausea and vomiting.  Genitourinary:  Negative for dysuria.  Neurological:  Negative for weakness.    Updated Vital Signs BP (!) 144/94 (BP Location: Right Arm)   Pulse 80   Temp 98.4 F (36.9 C) (Oral)   Resp 18   Ht 1.803 m (5' 11)   Wt 136.1 kg   SpO2 97%   BMI 41.84 kg/m   Physical Exam CONSTITUTIONAL: Well developed/well nourished, no distress sitting in the chair HEAD: Normocephalic/atraumatic EYES: EOMI/PERRL ENMT: Mucous membranes moist NECK: supple no meningeal signs CV: S1/S2 noted, no murmurs/rubs/gallops noted LUNGS: Lungs are  clear to auscultation bilaterally, no apparent distress ABDOMEN: soft, mild LUQ tenderness, no rebound or guarding, bowel sounds noted throughout abdomen, no abdominal wall hernia noted GU: Left cva tenderness NEURO: Pt is awake/alert/appropriate, moves all extremitiesx4.  No facial droop.  Patient walks without difficulty EXTREMITIES: full ROM SKIN: warm, color normal PSYCH: no abnormalities of mood noted, alert and oriented to situation  (all labs ordered are listed, but only abnormal results are displayed) Labs Reviewed  URINALYSIS, ROUTINE W REFLEX MICROSCOPIC - Abnormal; Notable for the following components:      Result Value   Ketones, ur 15 (*)    All other components within normal limits  LIPASE, BLOOD  COMPREHENSIVE METABOLIC PANEL WITH GFR  CBC    EKG: EKG Interpretation Date/Time:  Sunday April 30 2024 23:15:39 EST Ventricular Rate:  63 PR Interval:  160 QRS Duration:  89 QT Interval:  380 QTC Calculation: 389 R Axis:   60  Text Interpretation: Sinus rhythm No previous ECGs available Confirmed by Midge Golas ([id number]) on 04/30/2024 11:42:04 PM  Radiology: CT Renal Stone Study Result Date: 05/01/2024 EXAM: CT ABDOMEN AND PELVIS WITHOUT CONTRAST 05/01/2024 01:35:00 AM TECHNIQUE: CT of the abdomen and pelvis was performed without the administration of intravenous contrast. Multiplanar reformatted images are provided for review. Automated exposure control, iterative reconstruction, and/or weight-based adjustment of the mA/kV was utilized to reduce the radiation dose to as low as reasonably achievable. COMPARISON: None available. CLINICAL HISTORY: Abdominal/flank  pain, stone suspected. FINDINGS: LOWER CHEST: No acute abnormality. LIVER: The liver is unremarkable. GALLBLADDER AND BILE DUCTS: Gallbladder is unremarkable. No biliary ductal dilatation. SPLEEN: No acute abnormality. PANCREAS: No acute abnormality. ADRENAL GLANDS: No acute abnormality. KIDNEYS, URETERS AND  BLADDER: No stones in the kidneys or ureters. No hydronephrosis. No perinephric or periureteral stranding. Urinary bladder is unremarkable. GI AND BOWEL: Stomach demonstrates no acute abnormality. There is no bowel obstruction. Normal appendix (image 85). PERITONEUM AND RETROPERITONEUM: No ascites. No free air. VASCULATURE: Aorta is normal in caliber. LYMPH NODES: No lymphadenopathy. REPRODUCTIVE ORGANS: The prostate is unremarkable. BONES AND SOFT TISSUES: Mild degenerative changes at T11-T12 and L4-L5. No acute osseous abnormality. No focal soft tissue abnormality. IMPRESSION: 1. No acute findings in the abdomen or pelvis. Electronically signed by: Pinkie Pebbles MD 05/01/2024 01:53 AM EST RP Workstation: [id number]     Procedures   Medications Ordered in the ED  HYDROcodone -acetaminophen  (NORCO/VICODIN) 5-325 MG per tablet 2 tablet (2 tablets Oral Given 05/01/24 0137)    Clinical Course as of 05/01/24 0227  Mon May 01, 2024  0226 Patient reports all of his symptoms have resolved He is resting comfortably sitting on a stool I suspect he may have strained his abdominal musculature. No signs of acute abdominal or urologic emergency on CT imaging  Patient safe for discharge [DW]    Clinical Course User Index [DW] Midge Golas, MD                                 Medical Decision Making Amount and/or Complexity of Data Reviewed Labs: ordered. Radiology: ordered.  Risk Prescription drug management.   This patient presents to the ED for concern of abdominal pain, this involves an extensive number of treatment options, and is a complaint that carries with it a high risk of complications and morbidity.  The differential diagnosis includes but is not limited to cholecystitis, cholelithiasis, pancreatitis, gastritis, peptic ulcer disease, appendicitis, bowel obstruction, bowel perforation, diverticulitis, AAA, ischemic bowel, ureteral stone    Comorbidities that complicate the  patient evaluation: Patients presentation is complicated by their history of obesity  Social Determinants of Health: Patients lack of insurance and impaired access to primary care  increases the complexity of managing their presentation  Lab Tests: I Ordered, and personally interpreted labs.  The pertinent results include: Labs overall unremarkable  Imaging Studies ordered: I ordered imaging studies including CT scan renal  I independently visualized and interpreted imaging which showed no acute finding I agree with the radiologist interpretation   Medicines ordered and prescription drug management: I ordered medication including Vicodin for pain Reevaluation of the patient after these medicines showed that the patient    resolved  Reevaluation: After the interventions noted above, I reevaluated the patient and found that they have :improved  Complexity of problems addressed: Patients presentation is most consistent with  acute presentation with potential threat to life or bodily function  Disposition: After consideration of the diagnostic results and the patients response to treatment,  I feel that the patent would benefit from discharge  .        Final diagnoses:  Left upper quadrant abdominal pain  Uncontrolled hypertension    ED Discharge Orders     None          Midge Golas, MD 05/01/24 [phone]
